# Patient Record
Sex: Female | Born: 1970 | Race: White | Hispanic: No | Smoking: Never smoker
Health system: Southern US, Community
[De-identification: ages and names within clinical notes are randomized; demographics above are authoritative.]

## PROBLEM LIST (undated history)

## (undated) HISTORY — PX: WISDOM TOOTH EXTRACTION: SHX21

---

## 1992-06-17 HISTORY — PX: GYNECOLOGIC CRYOSURGERY: SHX857

## 2002-11-19 ENCOUNTER — Other Ambulatory Visit: Admission: RE | Admit: 2002-11-19 | Discharge: 2002-11-19 | Payer: Self-pay | Admitting: Obstetrics & Gynecology

## 2005-02-09 ENCOUNTER — Inpatient Hospital Stay (HOSPITAL_COMMUNITY): Admission: AD | Admit: 2005-02-09 | Discharge: 2005-02-11 | Payer: Self-pay | Admitting: Obstetrics & Gynecology

## 2007-08-24 ENCOUNTER — Inpatient Hospital Stay (HOSPITAL_COMMUNITY): Admission: AD | Admit: 2007-08-24 | Discharge: 2007-08-26 | Payer: Self-pay | Admitting: Obstetrics & Gynecology

## 2010-10-30 NOTE — H&P (Signed)
NAME:  Natalie Reeves, VANDERHEYDEN              ACCOUNT NO.:  [id number]   MEDICAL RECORD NO.:  [id number]          PATIENT TYPE:  INP   LOCATION:  9167                          FACILITY:  [facility]   PHYSICIAN:  Lenoard Aden, M.D.DATE OF BIRTH:  Jan 05, 1971   DATE OF ADMISSION:  08/24/2007  DATE OF DISCHARGE:                              HISTORY & PHYSICAL   CHIEF COMPLAINT:  Prodrome of labor.   HISTORY:  She is a 40 year old white female, G 3, P 2, at 40-3/7th  weeks, who presents with increased frequency of contractions.  She  desires no intervention.   ALLERGIES:  SULFA.   CURRENT MEDICATIONS:  Prenatal vitamins.   PAST MEDICAL/SURGICAL HISTORY:  1. She has a history of spontaneous vaginal delivery x2.  2. Cryosurgery.  3. Wisdom tooth extraction.  4. History of heart murmur which has not been medicated and was only      noted during pregnancy.   FAMILY HISTORY:  A family history of uterine cancer, tuberculosis, heart  disease, rheumatoid arthritis, migraine headaches and high cholesterol.   PRENATAL COURSE TO DATE:  By review of record is uncomplicated.   PHYSICAL EXAMINATION:  GENERAL:  She is a well-developed and well-  nourished white female, in no acute distress.  HEENT:  Normal.  LUNGS:  Clear.  HEART:  Regular rate and rhythm.  ABDOMEN:  Soft, gravid, nontender.  Estimated fetal weight is 6-1/2  pounds.  PELVIC EXAM:  Cervix is 4 to 5 cm, 90% vertex, 0-+1 station.  EXTREMITIES:  No cords.  NEUROLOGIC:  Nonfocal.  SKIN:  Intact.   IMPRESSION:  A 40-week intrauterine pregnancy with prodrome of labor  pattern, GBS negative.   PLAN:  Artificial rupture of membranes performed.  Pitocin augmentation  as needed.  Anticipated vaginal delivery.      Lenoard Aden, M.D.  Electronically Signed     RJT/MEDQ  D:  08/25/2007  T:  08/25/2007  Job:  [id number]

## 2011-03-11 LAB — CBC
HCT: 37.4
Hemoglobin: 12.8
MCV: 91.2
RBC: 3.95
RBC: 4.08
RDW: 12.8
WBC: 13.1 — ABNORMAL HIGH
WBC: 15.4 — ABNORMAL HIGH

## 2011-03-11 LAB — RPR: RPR Ser Ql: NONREACTIVE

## 2016-09-03 ENCOUNTER — Encounter: Payer: Self-pay | Admitting: Obstetrics & Gynecology

## 2017-10-16 ENCOUNTER — Other Ambulatory Visit: Payer: Self-pay | Admitting: Obstetrics & Gynecology

## 2017-10-16 ENCOUNTER — Telehealth: Payer: Self-pay | Admitting: *Deleted

## 2017-10-16 DIAGNOSIS — Z1231 Encounter for screening mammogram for malignant neoplasm of breast: Secondary | ICD-10-CM

## 2017-10-16 MED ORDER — NORETHINDRONE 0.35 MG PO TABS: 1.0000 | ORAL_TABLET | Freq: Every day | ORAL | 1 refills | Status: DC

## 2017-10-16 NOTE — Telephone Encounter (Signed)
Patient has annual scheduled on 02/03/18, needs refill on Errin 0.35 mg 1 po daily #84 Rx sent. Paper chart has arrived.

## 2017-11-12 ENCOUNTER — Ambulatory Visit: Payer: Self-pay

## 2017-12-03 ENCOUNTER — Ambulatory Visit
Admission: RE | Admit: 2017-12-03 | Discharge: 2017-12-03 | Disposition: A | Discharge status: Home / Self Care | Payer: BC Managed Care – PPO | Source: Ambulatory Visit | Attending: Obstetrics & Gynecology | Admitting: Obstetrics & Gynecology

## 2017-12-03 ENCOUNTER — Other Ambulatory Visit: Payer: Self-pay | Admitting: Obstetrics & Gynecology

## 2017-12-03 DIAGNOSIS — Z1231 Encounter for screening mammogram for malignant neoplasm of breast: Secondary | ICD-10-CM

## 2018-02-03 ENCOUNTER — Encounter: Payer: Self-pay | Admitting: Obstetrics & Gynecology

## 2018-02-03 ENCOUNTER — Ambulatory Visit: Payer: BC Managed Care – PPO | Admitting: Obstetrics & Gynecology

## 2018-02-03 VITALS — BP 118/74 | Ht 65.0 in | Wt 149.0 lb

## 2018-02-03 DIAGNOSIS — R87810 Cervical high risk human papillomavirus (HPV) DNA test positive: Secondary | ICD-10-CM | POA: Diagnosis not present

## 2018-02-03 DIAGNOSIS — F3281 Premenstrual dysphoric disorder: Secondary | ICD-10-CM

## 2018-02-03 DIAGNOSIS — Z9189 Other specified personal risk factors, not elsewhere classified: Secondary | ICD-10-CM

## 2018-02-03 DIAGNOSIS — Z1151 Encounter for screening for human papillomavirus (HPV): Secondary | ICD-10-CM

## 2018-02-03 DIAGNOSIS — Z01419 Encounter for gynecological examination (general) (routine) without abnormal findings: Secondary | ICD-10-CM

## 2018-02-03 MED ORDER — NORETHINDRONE 0.35 MG PO TABS: 1.0000 | ORAL_TABLET | Freq: Every day | ORAL | 4 refills | Status: DC

## 2018-02-03 NOTE — Addendum Note (Signed)
Addended by: Berna SpareASTILLO, BLANCA A on: 02/03/2018 04:49 PM   Modules accepted: Orders

## 2018-02-03 NOTE — Progress Notes (Signed)
Natalie Reeves 05-09-1971 [id number]   History:    47 y.o. G3P3L3 Married. Vasectomy.  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  2 youngest are in elementary and middle school.  Daughter is 45 yo with 3 children.  RP:  Established patient presenting for annual gyn exam   HPI: Well on the progestin only pill to control PMDD.  But progressively the PMDD symptoms were re-increasing, therefore patient was started on Effexor 25 mg twice daily recently by her family physician.  No breakthrough bleeding.  No pelvic pain.  No pain with intercourse.  Urine and bowel movements normal.  Breasts normal.  Body mass index 24.79.  Physically active on a regular basis.  Health labs normal with family physician.  Past medical history,surgical history, family history and social history were all reviewed and documented in the EPIC chart.  Gynecologic History Patient's last menstrual period was 01/15/2018. Contraception: abstinence and vasectomy Last Pap: 08/2016. Results were: Negative/HPV HR negative.  (HPV HR pos in 2017) Last mammogram: 11/2017. Results were: Negative Bone Density: Never Colonoscopy: Never  Obstetric History OB History  Gravida Para Term Preterm AB Living  3 3       3   SAB TAB Ectopic Multiple Live Births               # Outcome Date GA Lbr Len/2nd Weight Sex Delivery Anes PTL Lv  3 Para           2 Para           1 Para              ROS: A ROS was performed and pertinent positives and negatives are included in the history.  GENERAL: No fevers or chills. HEENT: No change in vision, no earache, sore throat or sinus congestion. NECK: No pain or stiffness. CARDIOVASCULAR: No chest pain or pressure. No palpitations. PULMONARY: No shortness of breath, cough or wheeze. GASTROINTESTINAL: No abdominal pain, nausea, vomiting or diarrhea, melena or bright red blood per rectum. GENITOURINARY: No urinary frequency, urgency, hesitancy or dysuria. MUSCULOSKELETAL: No joint or muscle pain, no back  pain, no recent trauma. DERMATOLOGIC: No rash, no itching, no lesions. ENDOCRINE: No polyuria, polydipsia, no heat or cold intolerance. No recent change in weight. HEMATOLOGICAL: No anemia or easy bruising or bleeding. NEUROLOGIC: No headache, seizures, numbness, tingling or weakness. PSYCHIATRIC: No depression, no loss of interest in normal activity or change in sleep pattern.     Exam:   BP 118/74   Ht 5\' 5"  (1.651 m)   Wt 149 lb (67.6 kg)   LMP 01/15/2018 Comment: PILL  BMI 24.79 kg/m   Body mass index is 24.79 kg/m.  General appearance : Well developed well nourished female. No acute distress HEENT: Eyes: no retinal hemorrhage or exudates,  Neck supple, trachea midline, no carotid bruits, no thyroidmegaly Lungs: Clear to auscultation, no rhonchi or wheezes, or rib retractions  Heart: Regular rate and rhythm, no murmurs or gallops Breast:Examined in sitting and supine position were symmetrical in appearance, no palpable masses or tenderness,  no skin retraction, no nipple inversion, no nipple discharge, no skin discoloration, no axillary or supraclavicular lymphadenopathy Abdomen: no palpable masses or tenderness, no rebound or guarding Extremities: no edema or skin discoloration or tenderness  Pelvic: Vulva: Normal             Vagina: No gross lesions or discharge  Cervix: No gross lesions or discharge.  Pap/HPV HR done  Uterus  AV, normal size, shape and consistency, non-tender and mobile  Adnexa  Without masses or tenderness  Anus: Normal   Assessment/Plan:  47 y.o. female for annual exam   1. Encounter for routine gynecological examination with Papanicolaou smear of cervix Normal gynecologic exam.  Pap with high-risk HPV done today.  Breast exam normal.  Screening mammogram was negative in June 2019.  Fasting health labs with family physician were all normal.  Body mass index 24.79.  Physically active regularly and healthy nutrition.  2. Relies on partner vasectomy for  contraception  3. PMDD (premenstrual dysphoric disorder) PMDD improved on the progestin only pill, but recently symptoms were really increasing.  Therefore started on Effexor 25 mg twice a day by her family physician recently.   4. Cervical high risk HPV (human papillomavirus) test positive High risk HPV was positive in 2017, it was negative in 2018.  Pap test with high risk HPV done today.  Other orders - venlafaxine (EFFEXOR) 25 MG tablet; Take 25 mg by mouth 2 (two) times daily. - norethindrone (MICRONOR,CAMILA,ERRIN) 0.35 MG tablet; Take 1 tablet (0.35 mg total) by mouth daily.  [name] MD, 2:54 PM 02/03/2018

## 2018-02-03 NOTE — Patient Instructions (Signed)
1. Encounter for routine gynecological examination with Papanicolaou smear of cervix Normal gynecologic exam.  Pap with high-risk HPV done today.  Breast exam normal.  Screening mammogram was negative in June 2019.  Fasting health labs with family physician were all normal.  Body mass index 24.79.  Physically active regularly and healthy nutrition.  2. Relies on partner vasectomy for contraception  3. PMDD (premenstrual dysphoric disorder) PMDD improved on the progestin only pill, but recently symptoms were really increasing.  Therefore started on Effexor 25 mg twice a day by her family physician recently.   4. Cervical high risk HPV (human papillomavirus) test positive High risk HPV was positive in 2017, it was negative in 2018.  Pap test with high risk HPV done today.  Other orders - venlafaxine (EFFEXOR) 25 MG tablet; Take 25 mg by mouth 2 (two) times daily. - norethindrone (MICRONOR,CAMILA,ERRIN) 0.35 MG tablet; Take 1 tablet (0.35 mg total) by mouth daily.  Natalie Reeves, it was a pleasure seeing you today!  I will inform you of your results as soon as they are available.

## 2018-02-04 LAB — PAP, TP IMAGING W/ HPV RNA, RFLX HPV TYPE 16,18/45: HPV DNA High Risk: NOT DETECTED

## 2018-12-11 ENCOUNTER — Other Ambulatory Visit: Payer: Self-pay | Admitting: Obstetrics & Gynecology

## 2018-12-11 DIAGNOSIS — Z1231 Encounter for screening mammogram for malignant neoplasm of breast: Secondary | ICD-10-CM

## 2019-01-26 ENCOUNTER — Other Ambulatory Visit: Payer: Self-pay

## 2019-01-26 ENCOUNTER — Ambulatory Visit
Admission: RE | Admit: 2019-01-26 | Discharge: 2019-01-26 | Disposition: A | Discharge status: Home / Self Care | Payer: BC Managed Care – PPO | Source: Ambulatory Visit | Attending: Obstetrics & Gynecology | Admitting: Obstetrics & Gynecology

## 2019-01-26 DIAGNOSIS — Z1231 Encounter for screening mammogram for malignant neoplasm of breast: Secondary | ICD-10-CM

## 2019-02-05 ENCOUNTER — Ambulatory Visit: Payer: BC Managed Care – PPO | Admitting: Obstetrics & Gynecology

## 2019-02-05 ENCOUNTER — Encounter: Payer: Self-pay | Admitting: Obstetrics & Gynecology

## 2019-02-05 ENCOUNTER — Other Ambulatory Visit: Payer: Self-pay

## 2019-02-05 VITALS — BP 128/78 | Ht 64.75 in | Wt 159.0 lb

## 2019-02-05 DIAGNOSIS — F3281 Premenstrual dysphoric disorder: Secondary | ICD-10-CM

## 2019-02-05 DIAGNOSIS — Z01419 Encounter for gynecological examination (general) (routine) without abnormal findings: Secondary | ICD-10-CM | POA: Diagnosis not present

## 2019-02-05 DIAGNOSIS — R87810 Cervical high risk human papillomavirus (HPV) DNA test positive: Secondary | ICD-10-CM | POA: Diagnosis not present

## 2019-02-05 DIAGNOSIS — Z9189 Other specified personal risk factors, not elsewhere classified: Secondary | ICD-10-CM

## 2019-02-05 MED ORDER — NORETHINDRONE 0.35 MG PO TABS: 1.0000 | ORAL_TABLET | Freq: Every day | ORAL | 4 refills | Status: DC

## 2019-02-05 NOTE — Progress Notes (Signed)
Natalie Reeves 02/26/1971 [id number]   History:    48 y.o. G3P3L3 Married. Vasectomy.  Nature conservation officer in Nurse, children's at [employer].  2 youngest are in elementary and middle school.  Daughter is 81 yo with 3 children.  RP:  Established patient presenting for annual gyn exam   HPI: PMDD symptoms controled on the Progestin-only BCPs and Effexor 75 mg twice daily.  No breakthrough bleeding.  Menses every 3-4 weeks.  No pelvic pain. No pain with intercourse.  Urine and bowel movements normal.  Breasts normal.  Body mass index 26.66.  Will increase physical activities and decrease calories.  Health labs normal with family physician.   Past medical history,surgical history, family history and social history were all reviewed and documented in the EPIC chart.  Gynecologic History Patient's last menstrual period was 01/20/2019. Contraception: vasectomy Last Pap: 01/2018. Results were: Negative/HPV HR neg.  H/O HPV HR positive. Last mammogram: 01/2019 Results were: Negative Bone Density: Never Colonoscopy: Never  Obstetric History OB History  Gravida Para Term Preterm AB Living  3 3       3   SAB TAB Ectopic Multiple Live Births               # Outcome Date GA Lbr Len/2nd Weight Sex Delivery Anes PTL Lv  3 Para           2 Para           1 Para              ROS: A ROS was performed and pertinent positives and negatives are included in the history.  GENERAL: No fevers or chills. HEENT: No change in vision, no earache, sore throat or sinus congestion. NECK: No pain or stiffness. CARDIOVASCULAR: No chest pain or pressure. No palpitations. PULMONARY: No shortness of breath, cough or wheeze. GASTROINTESTINAL: No abdominal pain, nausea, vomiting or diarrhea, melena or bright red blood per rectum. GENITOURINARY: No urinary frequency, urgency, hesitancy or dysuria. MUSCULOSKELETAL: No joint or muscle pain, no back pain, no recent trauma. DERMATOLOGIC: No rash, no itching, no lesions. ENDOCRINE: No  polyuria, polydipsia, no heat or cold intolerance. No recent change in weight. HEMATOLOGICAL: No anemia or easy bruising or bleeding. NEUROLOGIC: No headache, seizures, numbness, tingling or weakness. PSYCHIATRIC: No depression, no loss of interest in normal activity or change in sleep pattern.     Exam:   BP 128/78   Ht 5' 4.75" (1.645 m)   Wt 159 lb (72.1 kg)   LMP 01/20/2019   BMI 26.66 kg/m   Body mass index is 26.66 kg/m.  General appearance : Well developed well nourished female. No acute distress HEENT: Eyes: no retinal hemorrhage or exudates,  Neck supple, trachea midline, no carotid bruits, no thyroidmegaly Lungs: Clear to auscultation, no rhonchi or wheezes, or rib retractions  Heart: Regular rate and rhythm, no murmurs or gallops Breast:Examined in sitting and supine position were symmetrical in appearance, no palpable masses or tenderness,  no skin retraction, no nipple inversion, no nipple discharge, no skin discoloration, no axillary or supraclavicular lymphadenopathy Abdomen: no palpable masses or tenderness, no rebound or guarding Extremities: no edema or skin discoloration or tenderness  Pelvic: Vulva: Normal             Vagina: No gross lesions or discharge  Cervix: No gross lesions or discharge.  Pap reflex done.  Uterus  AV, normal size, shape and consistency, non-tender and mobile  Adnexa  Without masses or tenderness  Anus: Normal   Assessment/Plan:  48 y.o. female for annual exam   1. Encounter for routine gynecological examination with Papanicolaou smear of cervix Normal gynecologic exam.  Pap reflex done.  Breast exam normal.  Last mammography August 2020 was negative.  Health labs with family physician.  Body mass index 26.66.  Recommend a mild decrease in calories/carbs.  Will increase aerobic activities to 5 times a week and weightlifting every 2 days.  2. Cervical high risk human papillomavirus (HPV) DNA test positive  3. Relies on partner vasectomy  for contraception  4. PMDD (premenstrual dysphoric disorder) Controlled on the progestin only birth control pill and Effexor.  No contraindication to continue.  Prescription sent to pharmacy.  Other orders - venlafaxine (EFFEXOR) 75 MG tablet; Take 75 mg by mouth 2 (two) times daily. - norethindrone (MICRONOR) 0.35 MG tablet; Take 1 tablet (0.35 mg total) by mouth daily.  Genia DelMarie-Lyne Taj Arteaga MD, 3:39 PM 02/05/2019

## 2019-02-08 LAB — PAP IG W/ RFLX HPV ASCU

## 2019-02-13 ENCOUNTER — Encounter: Payer: Self-pay | Admitting: Obstetrics & Gynecology

## 2019-02-13 NOTE — Patient Instructions (Signed)
1. Encounter for routine gynecological examination with Papanicolaou smear of cervix Normal gynecologic exam.  Pap reflex done.  Breast exam normal.  Last mammography August 2020 was negative.  Health labs with family physician.  Body mass index 26.66.  Recommend a mild decrease in calories/carbs.  Will increase aerobic activities to 5 times a week and weightlifting every 2 days.  2. Cervical high risk human papillomavirus (HPV) DNA test positive  3. Relies on partner vasectomy for contraception  4. PMDD (premenstrual dysphoric disorder) Controlled on the progestin only birth control pill and Effexor.  No contraindication to continue.  Prescription sent to pharmacy.  Other orders - venlafaxine (EFFEXOR) 75 MG tablet; Take 75 mg by mouth 2 (two) times daily. - norethindrone (MICRONOR) 0.35 MG tablet; Take 1 tablet (0.35 mg total) by mouth daily.  Natalie Reeves, it was a pleasure seeing you today!  I will inform you of your results as soon as they are available.

## 2019-12-17 ENCOUNTER — Other Ambulatory Visit: Payer: Self-pay | Admitting: Obstetrics & Gynecology

## 2019-12-17 DIAGNOSIS — Z1231 Encounter for screening mammogram for malignant neoplasm of breast: Secondary | ICD-10-CM

## 2020-01-27 ENCOUNTER — Other Ambulatory Visit: Payer: Self-pay

## 2020-01-27 ENCOUNTER — Ambulatory Visit
Admission: RE | Admit: 2020-01-27 | Discharge: 2020-01-27 | Disposition: A | Discharge status: Home / Self Care | Payer: BC Managed Care – PPO | Source: Ambulatory Visit | Attending: Obstetrics & Gynecology | Admitting: Obstetrics & Gynecology

## 2020-01-27 DIAGNOSIS — Z1231 Encounter for screening mammogram for malignant neoplasm of breast: Secondary | ICD-10-CM

## 2020-02-25 ENCOUNTER — Encounter: Payer: Self-pay | Admitting: Obstetrics & Gynecology

## 2020-02-25 ENCOUNTER — Other Ambulatory Visit: Payer: Self-pay

## 2020-02-25 ENCOUNTER — Ambulatory Visit: Payer: BC Managed Care – PPO | Admitting: Obstetrics & Gynecology

## 2020-02-25 VITALS — BP 124/78 | Ht 65.0 in | Wt 143.0 lb

## 2020-02-25 DIAGNOSIS — Z23 Encounter for immunization: Secondary | ICD-10-CM | POA: Diagnosis not present

## 2020-02-25 DIAGNOSIS — F3281 Premenstrual dysphoric disorder: Secondary | ICD-10-CM

## 2020-02-25 DIAGNOSIS — Z01419 Encounter for gynecological examination (general) (routine) without abnormal findings: Secondary | ICD-10-CM

## 2020-02-25 DIAGNOSIS — Z9189 Other specified personal risk factors, not elsewhere classified: Secondary | ICD-10-CM

## 2020-02-25 MED ORDER — NORETHINDRONE 0.35 MG PO TABS: 1.0000 | ORAL_TABLET | Freq: Every day | ORAL | 4 refills | Status: DC

## 2020-02-25 NOTE — Addendum Note (Signed)
Addended by: Berna Spare A on: 02/25/2020 03:04 PM   Modules accepted: Orders

## 2020-02-25 NOTE — Progress Notes (Signed)
Natalie Reeves [birth year] [id number]   History:    49 y.o. G3P3L3 Married. Vasectomy. 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 Daughter is 31 yo with 3 children.  [id number] presenting for annual gyn exam   HPI: PMDD symptoms controled on the Progestin-only BCPs and Effexor 75 mg twice daily. No breakthrough bleeding currently with menses every 4 weeks.  (Had a few months of BTB every 2 weeks in the spring) No pelvic pain. No pain with intercourse. Urine and bowel movements normal. Breasts normal. Body mass index 23.8.  Good fitness and nutrition. Health labs normal with family physician.   Past medical history,surgical history, family history and social history were all reviewed and documented in the EPIC chart.  Gynecologic History Patient's last menstrual period was 02/04/2020.  Obstetric History OB History  Gravida Para Term Preterm AB Living  3 3       3   SAB TAB Ectopic Multiple Live Births               # Outcome Date GA Lbr Len/2nd Weight Sex Delivery Anes PTL Lv  3 Para           2 Para           1 Para              ROS: A ROS was performed and pertinent positives and negatives are included in the history.  GENERAL: No fevers or chills. HEENT: No change in vision, no earache, sore throat or sinus congestion. NECK: No pain or stiffness. CARDIOVASCULAR: No chest pain or pressure. No palpitations. PULMONARY: No shortness of breath, cough or wheeze. GASTROINTESTINAL: No abdominal pain, nausea, vomiting or diarrhea, melena or bright red blood per rectum. GENITOURINARY: No urinary frequency, urgency, hesitancy or dysuria. MUSCULOSKELETAL: No joint or muscle pain, no back pain, no recent trauma. DERMATOLOGIC: No rash, no itching, no lesions. ENDOCRINE: No polyuria, polydipsia, no heat or cold intolerance. No recent change in weight. HEMATOLOGICAL: No anemia or easy bruising or bleeding. NEUROLOGIC: No headache,  seizures, numbness, tingling or weakness. PSYCHIATRIC: No depression, no loss of interest in normal activity or change in sleep pattern.     Exam:   BP 124/78   Ht 5\' 5"  (1.651 m)   Wt 143 lb (64.9 kg)   LMP 02/04/2020 Comment: PILL  BMI 23.80 kg/m   Body mass index is 23.8 kg/m.  General appearance : Well developed well nourished female. No acute distress HEENT: Eyes: no retinal hemorrhage or exudates,  Neck supple, trachea midline, no carotid bruits, no thyroidmegaly Lungs: Clear to auscultation, no rhonchi or wheezes, or rib retractions  Heart: Regular rate and rhythm, no murmurs or gallops Breast:Examined in sitting and supine position were symmetrical in appearance, no palpable masses or tenderness,  no skin retraction, no nipple inversion, no nipple discharge, no skin discoloration, no axillary or supraclavicular lymphadenopathy Abdomen: no palpable masses or tenderness, no rebound or guarding Extremities: no edema or skin discoloration or tenderness  Pelvic: Vulva: Normal             Vagina: No gross lesions or discharge  Cervix: No gross lesions or discharge  Uterus  AV, normal size, shape and consistency, non-tender and mobile  Adnexa  Without masses or tenderness  Anus: Normal   Assessment/Plan:  49 y.o. female for annual exam   1. Well female exam with routine gynecological exam Normal gynecologic exam.  Pap test August 2020  was negative with negative high-risk HPV, no indication to repeat this year.  Breast exam normal.  Screening mammogram August 2021 was negative.  Health labs with family physician.  Very good body mass index at 23.8.  Continue with fitness and healthy nutrition.  2. PMDD (premenstrual dysphoric disorder) PMDD controlled on the progestin only pill and Effexor 75 mg twice daily.  No contraindication to continue.  We will continue with same management.  Prescription of the progestin pill sent to pharmacy.  Precautions given in case patient  experiences severe breakthrough bleeding.  3. Relies on partner vasectomy for contraception  Other orders - norethindrone (MICRONOR) 0.35 MG tablet; Take 1 tablet (0.35 mg total) by mouth daily.  Genia Del MD, 2:34 PM 02/25/2020

## 2020-08-13 IMAGING — MG DIGITAL SCREENING BILAT W/ TOMO W/ CAD
8 series · 9 of 24 positions shown · non-contrast
Comparison: Previous exam(s).

CLINICAL DATA: Screening.

EXAM:
DIGITAL SCREENING BILATERAL MAMMOGRAM WITH TOMO AND CAD

[L CC synth-2D]
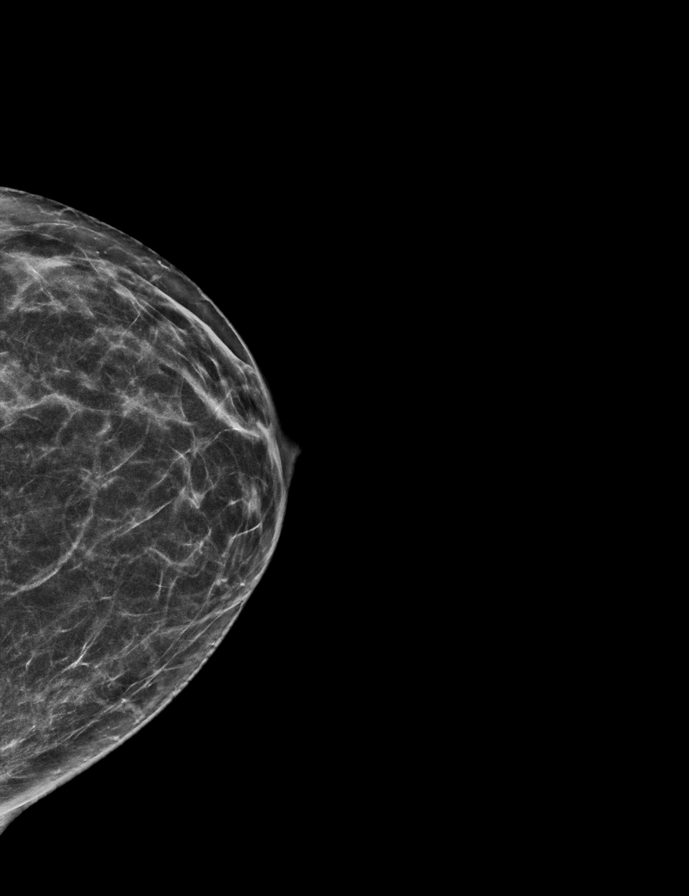

[R MLO synth-2D]
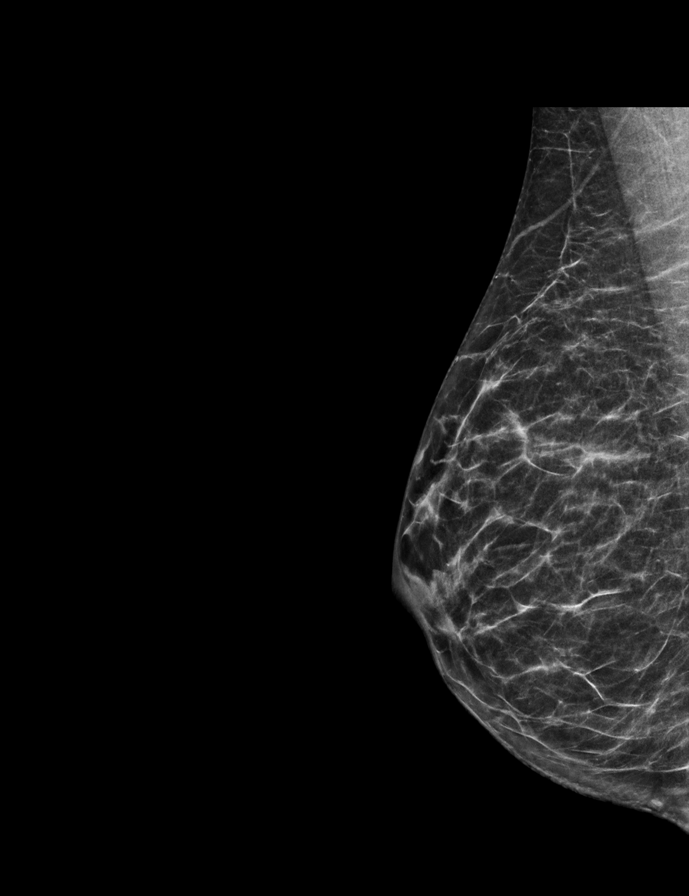

[R CC synth-2D]
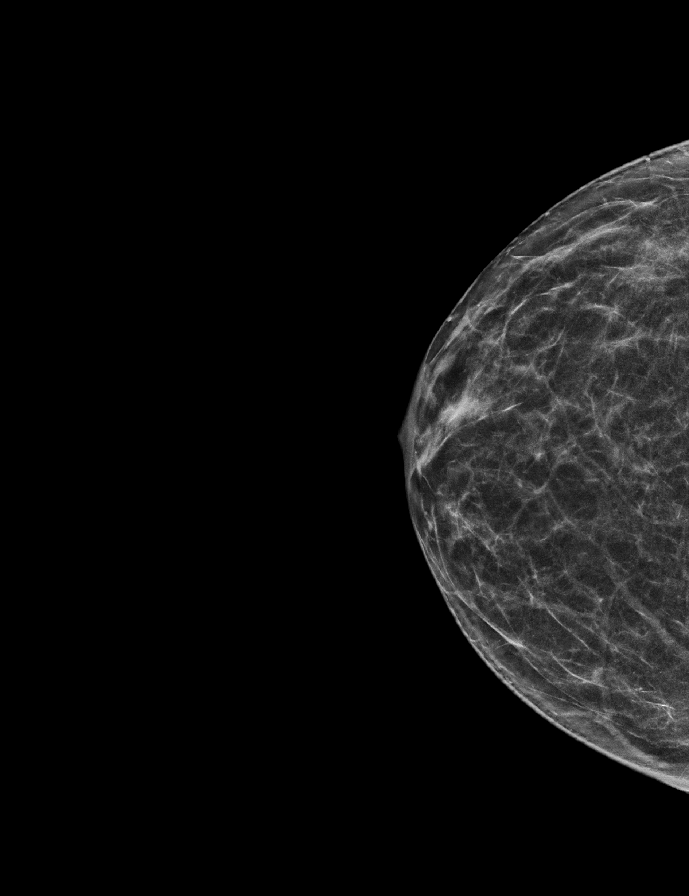

[L MLO synth-2D]
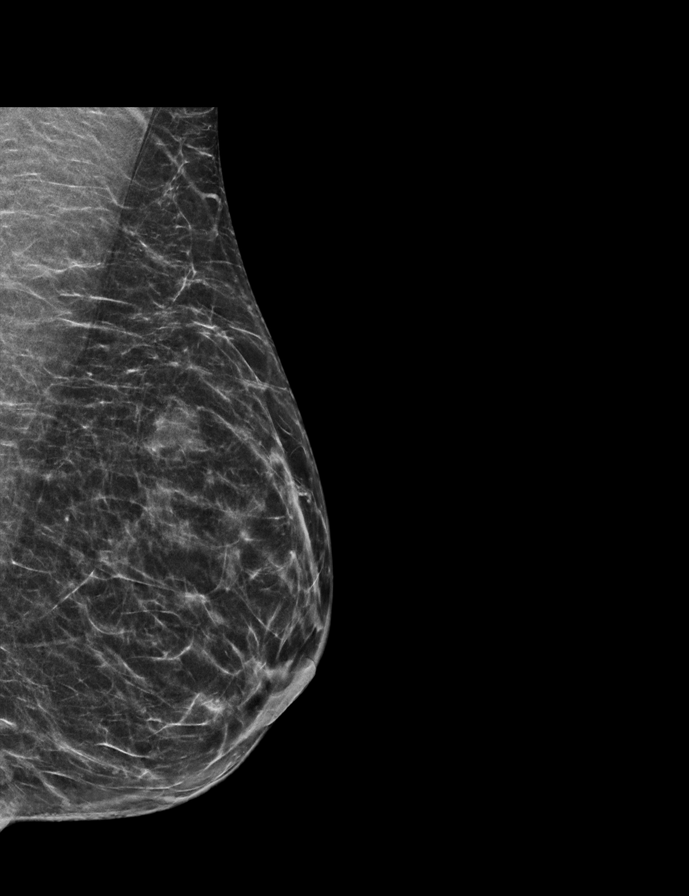

[R CC tomo · 2 of 51 frames shown]
[frame 17/51]
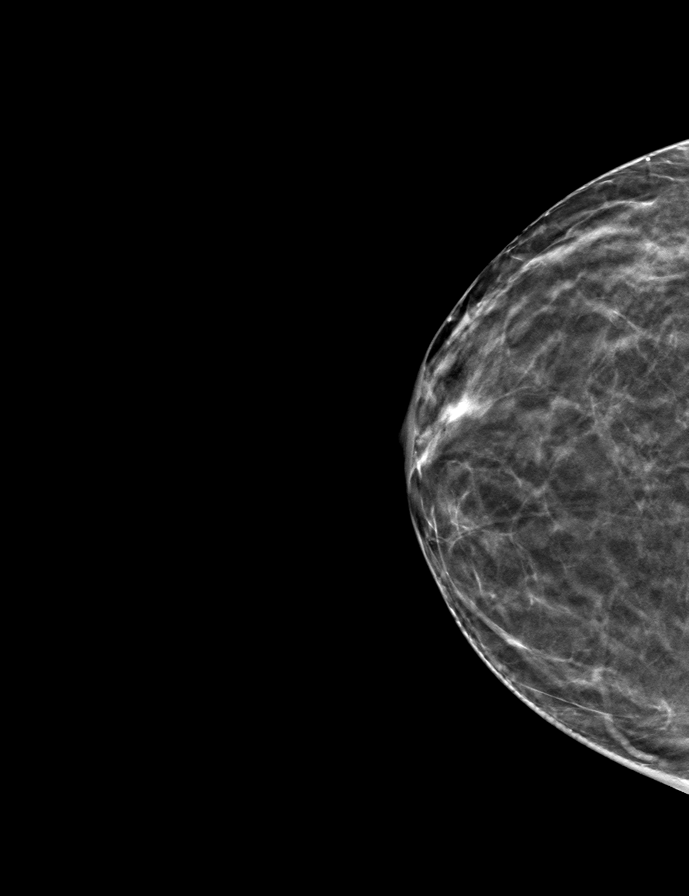
[frame 26/51]
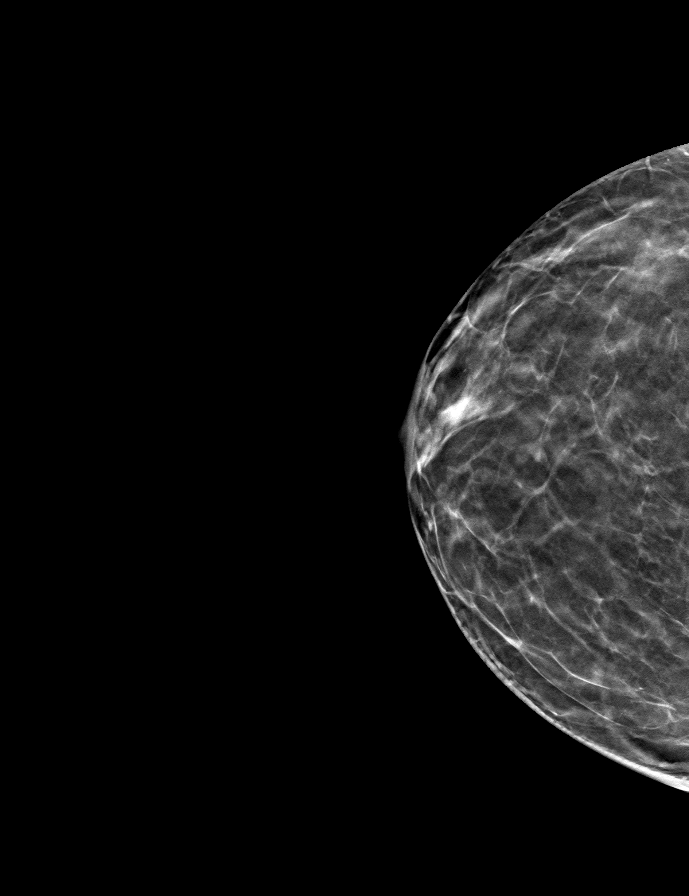

[L CC tomo · tomo slice 27/53.0]
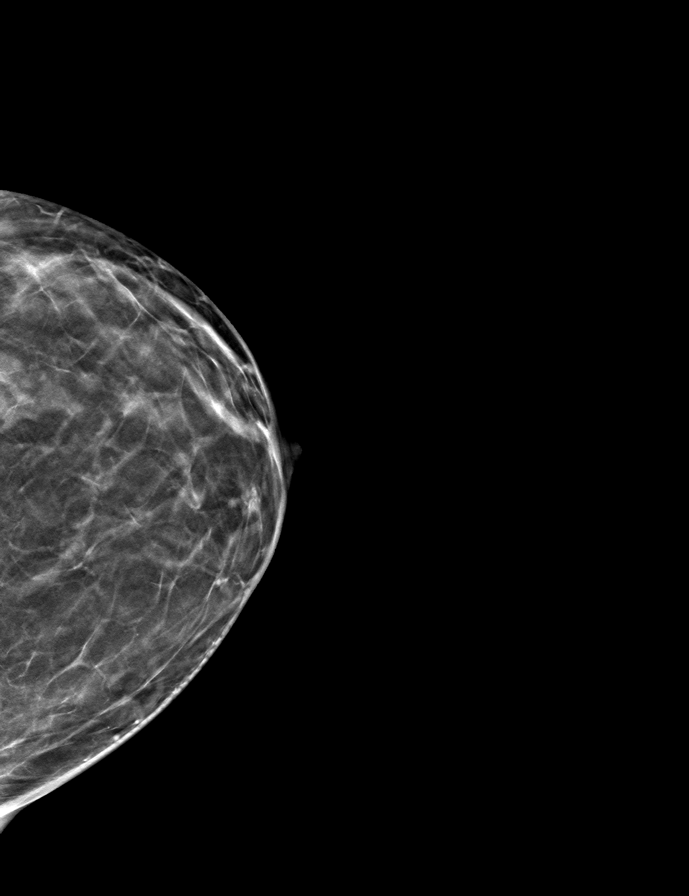

[L MLO tomo · tomo slice 27/54.0]
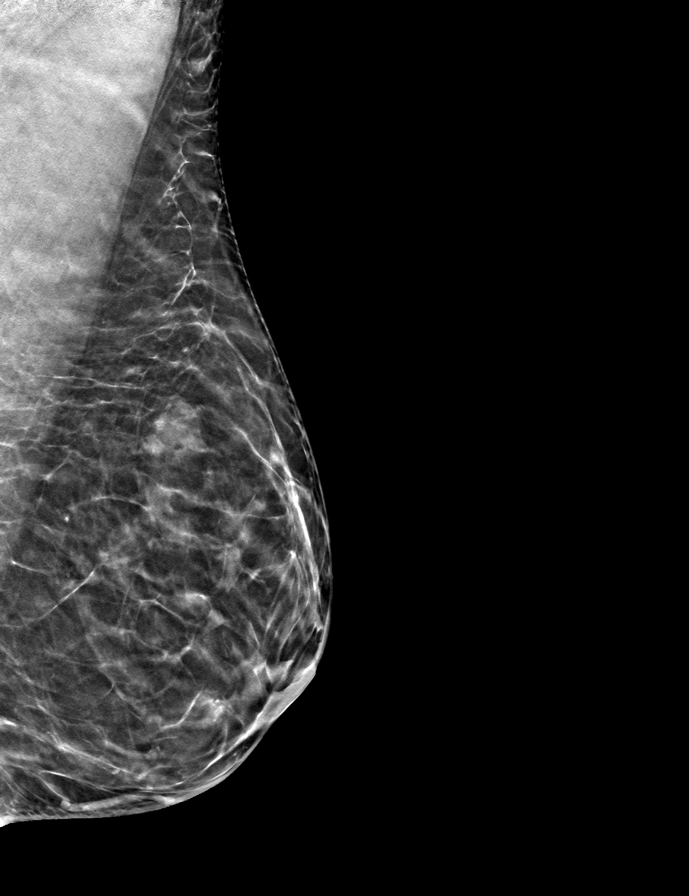

[R MLO tomo · tomo slice 27/53.0]
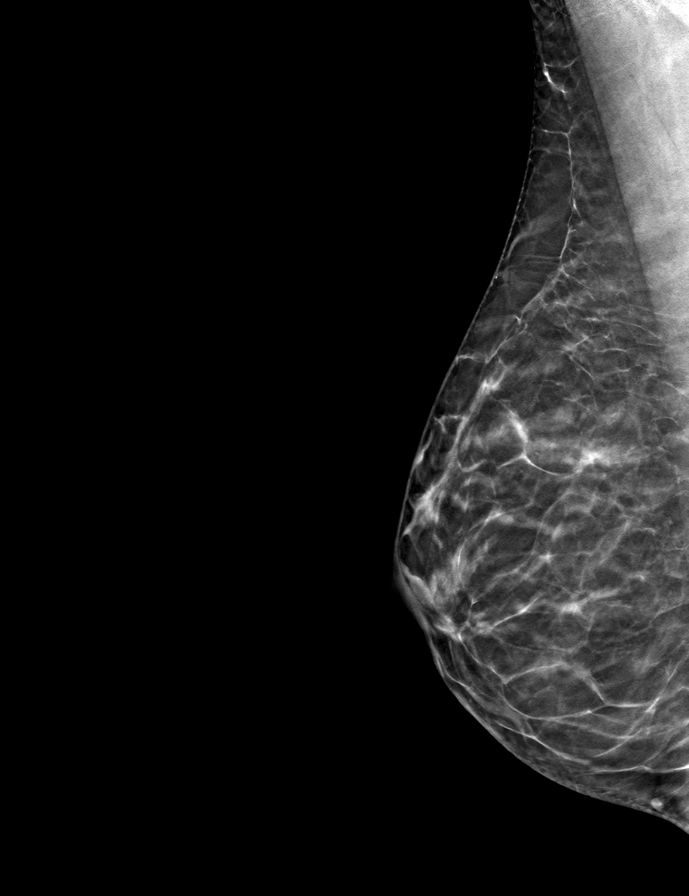

[9 of 24 positions shown; findings below may reference images not displayed]

ACR Breast Density Category b: There are scattered areas of
fibroglandular density.
FINDINGS: There are no findings suspicious for malignancy. Images were
processed with CAD.
IMPRESSION: No mammographic evidence of malignancy. A result letter of this
screening mammogram will be mailed directly to the patient.

RECOMMENDATION:
Screening mammogram in one year. (Code:[id number])

BI-RADS CATEGORY  1: Negative.

## 2021-01-09 ENCOUNTER — Other Ambulatory Visit: Payer: Self-pay | Admitting: Obstetrics & Gynecology

## 2021-01-09 DIAGNOSIS — Z1231 Encounter for screening mammogram for malignant neoplasm of breast: Secondary | ICD-10-CM

## 2021-02-28 DIAGNOSIS — Z1231 Encounter for screening mammogram for malignant neoplasm of breast: Secondary | ICD-10-CM

## 2021-04-02 ENCOUNTER — Ambulatory Visit
Admission: RE | Admit: 2021-04-02 | Discharge: 2021-04-02 | Disposition: A | Discharge status: Home / Self Care | Payer: BC Managed Care – PPO | Source: Ambulatory Visit | Attending: Obstetrics & Gynecology | Admitting: Obstetrics & Gynecology

## 2021-04-02 ENCOUNTER — Other Ambulatory Visit: Payer: Self-pay

## 2021-04-02 DIAGNOSIS — Z1231 Encounter for screening mammogram for malignant neoplasm of breast: Secondary | ICD-10-CM

## 2021-04-17 ENCOUNTER — Ambulatory Visit: Payer: BC Managed Care – PPO | Admitting: Obstetrics & Gynecology

## 2021-04-28 ENCOUNTER — Other Ambulatory Visit: Payer: Self-pay | Admitting: Obstetrics & Gynecology

## 2021-04-30 NOTE — Telephone Encounter (Signed)
Annual exam scheduled on 05/02/21

## 2021-05-02 ENCOUNTER — Other Ambulatory Visit: Payer: Self-pay

## 2021-05-02 ENCOUNTER — Ambulatory Visit (INDEPENDENT_AMBULATORY_CARE_PROVIDER_SITE_OTHER): Payer: BC Managed Care – PPO | Admitting: Obstetrics & Gynecology

## 2021-05-02 ENCOUNTER — Encounter: Payer: Self-pay | Admitting: Obstetrics & Gynecology

## 2021-05-02 VITALS — BP 110/78 | HR 72 | Ht 65.25 in | Wt 153.0 lb

## 2021-05-02 DIAGNOSIS — Z01419 Encounter for gynecological examination (general) (routine) without abnormal findings: Secondary | ICD-10-CM | POA: Diagnosis not present

## 2021-05-02 DIAGNOSIS — Z9189 Other specified personal risk factors, not elsewhere classified: Secondary | ICD-10-CM | POA: Diagnosis not present

## 2021-05-02 DIAGNOSIS — F3281 Premenstrual dysphoric disorder: Secondary | ICD-10-CM | POA: Diagnosis not present

## 2021-05-02 DIAGNOSIS — Z23 Encounter for immunization: Secondary | ICD-10-CM

## 2021-05-02 MED ORDER — NORETHINDRONE ACET-ETHINYL EST 1-20 MG-MCG PO TABS: 1.0000 | ORAL_TABLET | Freq: Every day | ORAL | 4 refills | Status: DC

## 2021-05-02 NOTE — Progress Notes (Signed)
Natalie Reeves Nov 15, 1970 [id number]   History:    50 y.o. G3P3L3 Married. Vasectomy.  Artist in Producer, television/film/video at [employer].  2 youngest are in middle and high school.  Daughter is 30 yo with 3 children.   RP:  Established patient presenting for annual gyn exam    HPI: PMDD symptoms controled on the Progestin-only BCPs, but unpredictable menses every 12-60 days.  On Effexor 75 mg twice daily. No pelvic pain. No pain with intercourse.  Pap Neg 01/2019.  Urine and bowel movements normal.  Breasts normal.  Screening mammo Neg 03/2021.  Body mass index 25.27.  Good fitness and nutrition.  Health labs normal with family physician.    Past medical history,surgical history, family history and social history were all reviewed and documented in the EPIC chart.  Gynecologic History Patient's last menstrual period was 04/15/2021 (exact date).  Obstetric History OB History  Gravida Para Term Preterm AB Living  3 3       3   SAB IAB Ectopic Multiple Live Births               # Outcome Date GA Lbr Len/2nd Weight Sex Delivery Anes PTL Lv  3 Para           2 Para           1 Para              ROS: A ROS was performed and pertinent positives and negatives are included in the history.  GENERAL: No fevers or chills. HEENT: No change in vision, no earache, sore throat or sinus congestion. NECK: No pain or stiffness. CARDIOVASCULAR: No chest pain or pressure. No palpitations. PULMONARY: No shortness of breath, cough or wheeze. GASTROINTESTINAL: No abdominal pain, nausea, vomiting or diarrhea, melena or bright red blood per rectum. GENITOURINARY: No urinary frequency, urgency, hesitancy or dysuria. MUSCULOSKELETAL: No joint or muscle pain, no back pain, no recent trauma. DERMATOLOGIC: No rash, no itching, no lesions. ENDOCRINE: No polyuria, polydipsia, no heat or cold intolerance. No recent change in weight. HEMATOLOGICAL: No anemia or easy bruising or bleeding. NEUROLOGIC: No headache, seizures, numbness,  tingling or weakness. PSYCHIATRIC: No depression, no loss of interest in normal activity or change in sleep pattern.     Exam:   BP 110/78   Pulse 72   Ht 5' 5.25" (1.657 m)   Wt 153 lb (69.4 kg)   LMP 04/15/2021 (Exact Date)   SpO2 96%   BMI 25.27 kg/m   Body mass index is 25.27 kg/m.  General appearance : Well developed well nourished female. No acute distress HEENT: Eyes: no retinal hemorrhage or exudates,  Neck supple, trachea midline, no carotid bruits, no thyroidmegaly Lungs: Clear to auscultation, no rhonchi or wheezes, or rib retractions  Heart: Regular rate and rhythm, no murmurs or gallops Breast:Examined in sitting and supine position were symmetrical in appearance, no palpable masses or tenderness,  no skin retraction, no nipple inversion, no nipple discharge, no skin discoloration, no axillary or supraclavicular lymphadenopathy Abdomen: no palpable masses or tenderness, no rebound or guarding Extremities: no edema or skin discoloration or tenderness  Pelvic: Vulva: Normal             Vagina: No gross lesions or discharge  Cervix: No gross lesions or discharge  Uterus  AV, normal size, shape and consistency, non-tender and mobile  Adnexa  Without masses or tenderness  Anus: Normal   Assessment/Plan:  50 y.o. female for annual exam  1. Well female exam with routine gynecological exam PMDD symptoms controled on the Progestin-only BCPs, but unpredictable menses every 12-60 days.  On Effexor 75 mg twice daily. No pelvic pain. No pain with intercourse.  Pap Neg 01/2019.  Urine and bowel movements normal.  Breasts normal.  Screening mammo Neg 03/2021.  Body mass index 25.27.  Good fitness and nutrition.  Health labs normal with family physician.   2. Relies on partner vasectomy for contraception  3. PMDD (premenstrual dysphoric disorder) Decision to control the cycle with the generic of LoEstrin 1/20 instead of the Progestin only pill.  No CI.  Usage reviewed and  prescription sent to pharmacy.  4. Need for immunization against influenza - Flu Vaccine QUAD 68mo+IM (Fluarix, Fluzone & Alfiuria Quad PF)  Other orders - ibuprofen (ADVIL) 200 MG tablet; 3 tablets as needed - ACIDOPHILUS LACTOBACILLUS PO; Take by mouth. - norethindrone-ethinyl estradiol (LOESTRIN) 1-20 MG-MCG tablet; Take 1 tablet by mouth daily.   Genia Del MD, 4:17 PM 05/02/2021

## 2022-05-01 ENCOUNTER — Other Ambulatory Visit: Payer: Self-pay | Admitting: Obstetrics & Gynecology

## 2022-05-01 ENCOUNTER — Ambulatory Visit
Admission: RE | Admit: 2022-05-01 | Discharge: 2022-05-01 | Disposition: A | Discharge status: Home / Self Care | Payer: BC Managed Care – PPO | Source: Ambulatory Visit | Attending: Obstetrics & Gynecology | Admitting: Obstetrics & Gynecology

## 2022-05-01 DIAGNOSIS — Z1231 Encounter for screening mammogram for malignant neoplasm of breast: Secondary | ICD-10-CM

## 2022-05-07 ENCOUNTER — Encounter: Payer: Self-pay | Admitting: Obstetrics & Gynecology

## 2022-05-07 ENCOUNTER — Ambulatory Visit (INDEPENDENT_AMBULATORY_CARE_PROVIDER_SITE_OTHER): Payer: BC Managed Care – PPO | Admitting: Obstetrics & Gynecology

## 2022-05-07 ENCOUNTER — Other Ambulatory Visit (HOSPITAL_COMMUNITY)
Admission: RE | Admit: 2022-05-07 | Discharge: 2022-05-07 | Disposition: A | Discharge status: Home / Self Care | Payer: BC Managed Care – PPO | Source: Ambulatory Visit | Attending: Obstetrics & Gynecology | Admitting: Obstetrics & Gynecology

## 2022-05-07 VITALS — BP 106/70 | HR 70 | Ht 64.5 in | Wt 158.0 lb

## 2022-05-07 DIAGNOSIS — F3281 Premenstrual dysphoric disorder: Secondary | ICD-10-CM | POA: Diagnosis not present

## 2022-05-07 DIAGNOSIS — Z01419 Encounter for gynecological examination (general) (routine) without abnormal findings: Secondary | ICD-10-CM

## 2022-05-07 DIAGNOSIS — Z9189 Other specified personal risk factors, not elsewhere classified: Secondary | ICD-10-CM

## 2022-05-07 MED ORDER — NORETHINDRONE 0.35 MG PO TABS: 1.0000 | ORAL_TABLET | Freq: Every day | ORAL | 4 refills | Status: DC

## 2022-05-07 NOTE — Progress Notes (Signed)
Natalie Reeves Mar 08, 1971 [id number]   History:    51 y.o. G3P3L3 Married. Vasectomy.  Artist in Producer, television/film/video at [employer].  2 youngest are in middle and high school.  Daughter is 67 yo with 3 children.   RP:  Established patient presenting for annual gyn exam    HPI: PMDD symptoms improved on BCPs with LoEstrin 1/20.  Having light menses every 1-3 months.  On Effexor 75 mg twice daily. No pelvic pain. No pain with intercourse.  Pap Neg 01/2019.  Repeat Pap today. Urine and bowel movements normal.  Breasts normal.  Screening mammo Neg 04/2022.  Body mass index 26.7.  Good fitness and nutrition.  Health labs with family physician.  Will schedule Colono this year.  Flu vaccine done at pharmacy.    Past medical history,surgical history, family history and social history were all reviewed and documented in the EPIC chart.  Gynecologic History Patient's last menstrual period was 02/08/2022 (exact date).  Obstetric History OB History  Gravida Para Term Preterm AB Living  3 3 3     3   SAB IAB Ectopic Multiple Live Births               # Outcome Date GA Lbr Len/2nd Weight Sex Delivery Anes PTL Lv  3 Term           2 Term           1 Term             ROS: A ROS was performed and pertinent positives and negatives are included in the history. GENERAL: No fevers or chills. HEENT: No change in vision, no earache, sore throat or sinus congestion. NECK: No pain or stiffness. CARDIOVASCULAR: No chest pain or pressure. No palpitations. PULMONARY: No shortness of breath, cough or wheeze. GASTROINTESTINAL: No abdominal pain, nausea, vomiting or diarrhea, melena or bright red blood per rectum. GENITOURINARY: No urinary frequency, urgency, hesitancy or dysuria. MUSCULOSKELETAL: No joint or muscle pain, no back pain, no recent trauma. DERMATOLOGIC: No rash, no itching, no lesions. ENDOCRINE: No polyuria, polydipsia, no heat or cold intolerance. No recent change in weight. HEMATOLOGICAL: No anemia or easy  bruising or bleeding. NEUROLOGIC: No headache, seizures, numbness, tingling or weakness. PSYCHIATRIC: No depression, no loss of interest in normal activity or change in sleep pattern.     Exam:   BP 106/70   Pulse 70   Ht 5' 4.5" (1.638 m)   Wt 158 lb (71.7 kg)   LMP 02/08/2022 (Exact Date) Comment: ocps  SpO2 98%   BMI 26.70 kg/m   Body mass index is 26.7 kg/m.  General appearance : Well developed well nourished female. No acute distress HEENT: Eyes: no retinal hemorrhage or exudates,  Neck supple, trachea midline, no carotid bruits, no thyroidmegaly Lungs: Clear to auscultation, no rhonchi or wheezes, or rib retractions  Heart: Regular rate and rhythm, no murmurs or gallops Breast:Examined in sitting and supine position were symmetrical in appearance, no palpable masses or tenderness,  no skin retraction, no nipple inversion, no nipple discharge, no skin discoloration, no axillary or supraclavicular lymphadenopathy Abdomen: no palpable masses or tenderness, no rebound or guarding Extremities: no edema or skin discoloration or tenderness  Pelvic: Vulva: Normal             Vagina: No gross lesions or discharge  Cervix: No gross lesions or discharge.  Pap reflex done.  Uterus  AV, normal size, shape and consistency, non-tender and mobile  Adnexa  Without masses or tenderness  Anus: Normal   Assessment/Plan:  51 y.o. female for annual exam   1. Encounter for routine gynecological examination with Papanicolaou smear of cervix PMDD symptoms improved on BCPs with LoEstrin 1/20.  Having light menses every 1-3 months.  Abnormal uterine bleeding precautions reviewed.  On Effexor 75 mg twice daily. No pelvic pain. No pain with intercourse.  Pap Neg 01/2019.  Repeat Pap today. Urine and bowel movements normal.  Breasts normal.  Screening mammo Neg 04/2022.  Body mass index 26.7.  Good fitness and nutrition.  Health labs with family physician.  Will schedule Colono this year.  Flu vaccine  done at pharmacy.   - Cytology - PAP( [place])  2. Relies on partner vasectomy for contraception  3. PMDD (premenstrual dysphoric disorder) PMDD improved on LoEstrin 1/20.  Patient turning 51 yo soon, decision to change the BCPs to a Progestin-only pill to lower the risks of DVT/PE/Stroke associated with Estradiol therapy.  Norethindrone 0.35 prescription sent to pharmacy.  Other orders - norethindrone (MICRONOR) 0.35 MG tablet; Take 1 tablet (0.35 mg total) by mouth daily.   Genia Del MD, 9:18 AM 05/07/2022

## 2022-05-09 LAB — CYTOLOGY - PAP
Comment: NEGATIVE
Diagnosis: NEGATIVE
High risk HPV: NEGATIVE

## 2023-04-10 ENCOUNTER — Other Ambulatory Visit: Payer: Self-pay | Admitting: Family Medicine

## 2023-04-10 DIAGNOSIS — Z1231 Encounter for screening mammogram for malignant neoplasm of breast: Secondary | ICD-10-CM

## 2023-05-07 ENCOUNTER — Other Ambulatory Visit: Payer: Self-pay | Admitting: Nurse Practitioner

## 2023-05-07 ENCOUNTER — Ambulatory Visit
Admission: RE | Admit: 2023-05-07 | Discharge: 2023-05-07 | Disposition: A | Discharge status: Home / Self Care | Payer: BC Managed Care – PPO | Source: Ambulatory Visit | Attending: Family Medicine | Admitting: Family Medicine

## 2023-05-07 DIAGNOSIS — Z1231 Encounter for screening mammogram for malignant neoplasm of breast: Secondary | ICD-10-CM

## 2023-05-12 ENCOUNTER — Ambulatory Visit (INDEPENDENT_AMBULATORY_CARE_PROVIDER_SITE_OTHER): Payer: BC Managed Care – PPO | Admitting: Nurse Practitioner

## 2023-05-12 ENCOUNTER — Encounter: Payer: Self-pay | Admitting: Nurse Practitioner

## 2023-05-12 VITALS — BP 112/72 | HR 60 | Ht 65.0 in | Wt 160.0 lb

## 2023-05-12 DIAGNOSIS — F3281 Premenstrual dysphoric disorder: Secondary | ICD-10-CM

## 2023-05-12 DIAGNOSIS — Z01419 Encounter for gynecological examination (general) (routine) without abnormal findings: Secondary | ICD-10-CM

## 2023-05-12 MED ORDER — NORETHINDRONE 0.35 MG PO TABS: 1.0000 | ORAL_TABLET | Freq: Every day | ORAL | 4 refills | Status: DC

## 2023-05-12 NOTE — Progress Notes (Signed)
Natalie Reeves 03/19/1971 [id number]   History:  52 y.o. G3P3003 presents for annual exam. Switched from COCs to POPs last year due to age per Dr. Seymour Bars. Taking for management of PMDD. Taking Effexor. Irregular menses, not new for her. Complains of some night sweats. Cryosurgery 30 years ago, normal paps since. Going through divorce. Ex husband has HSV and patient has questions about this. Planning to ask PCP to check when she has her annual blood work done.   Gynecologic History Patient's last menstrual period was 01/28/2023 (exact date).   Contraception/Family planning: oral progesterone-only contraceptive Sexually active: No  Health Maintenance Last Pap: 05/07/2022. Results were: Normal neg HPV, 5-year repeat Last mammogram: 05/07/2023. Results were: Normal Last colonoscopy: 2024. 5-year recall Last Dexa: Not indicated  Past medical history, past surgical history, family history and social history were all reviewed and documented in the EPIC chart. Separated. 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 Mother with history of uterine and breast cancer.   ROS:  A ROS was performed and pertinent positives and negatives are included.  Exam:  Vitals:   05/12/23 1500  BP: 112/72  Pulse: 60  SpO2: 98%  Weight: 160 lb (72.6 kg)  Height: 5\' 5"  (1.651 m)   Body mass index is 26.63 kg/m.   General appearance:  Normal Thyroid:  Symmetrical, normal in size, without palpable masses or nodularity. Respiratory  Auscultation:  Clear without wheezing or rhonchi Cardiovascular  Auscultation:  Regular rate, without rubs, murmurs or gallops  Edema/varicosities:  Not grossly evident Abdominal  Soft,nontender, without masses, guarding or rebound.  Liver/spleen:  No organomegaly noted  Hernia:  None appreciated  Skin  Inspection:  Grossly normal Breasts: Examined lying and sitting.   Right: Without masses, retractions, nipple  discharge or axillary adenopathy.   Left: Without masses, retractions, nipple discharge or axillary adenopathy. Pelvic: External genitalia:  no lesions              Urethra:  normal appearing urethra with no masses, tenderness or lesions              Bartholins and Skenes: normal                 Vagina: normal appearing vagina with normal color and discharge, no lesions              Cervix: no lesions Bimanual Exam:  Uterus:  no masses or tenderness              Adnexa: no mass, fullness, tenderness              Rectovaginal: Deferred              Anus:  normal, no lesions  Patient informed chaperone available to be present for breast and pelvic exam. Patient has requested no chaperone to be present. Patient has been advised what will be completed during breast and pelvic exam.   Assessment/Plan:  52 y.o. [id number] for annual exam.   Well female exam with routine gynecological exam - Education provided on SBEs, importance of preventative screenings, current guidelines, high calcium diet, regular exercise, and multivitamin daily. Labs with PCP.   PMDD (premenstrual dysphoric disorder) - Plan: norethindrone (MICRONOR) 0.35 MG tablet daily. Also taking Effexor. Good management.   Discussed HSV exposure, diagnosis and treatment. She is considering getting HSV antibody testing with PCP next month when she does her annual visit. Aware  no treatment will be done unless outbreaks occur. Verbalized understanding.   Screening for cervical cancer - Normal Pap history.  Will repeat at 5-year interval per guidelines.  Screening for breast cancer - Normal mammogram history.  Continue annual screenings.  Normal breast exam today.  Screening for colon cancer - 2024 colonoscopy. Will repeat at 5-year interval per GI's recommendation.   Return in about 1 year (around 05/11/2024) for Annual.   Olivia Mackie DNP, 3:34 PM 05/12/2023

## 2023-06-09 ENCOUNTER — Other Ambulatory Visit (HOSPITAL_BASED_OUTPATIENT_CLINIC_OR_DEPARTMENT_OTHER): Payer: Self-pay | Admitting: Family Medicine

## 2023-06-09 DIAGNOSIS — E785 Hyperlipidemia, unspecified: Secondary | ICD-10-CM

## 2023-07-03 ENCOUNTER — Encounter: Payer: Self-pay | Admitting: Nurse Practitioner

## 2023-07-03 ENCOUNTER — Ambulatory Visit (HOSPITAL_BASED_OUTPATIENT_CLINIC_OR_DEPARTMENT_OTHER)
Admission: RE | Admit: 2023-07-03 | Discharge: 2023-07-03 | Disposition: A | Discharge status: Home / Self Care | Payer: Self-pay | Source: Ambulatory Visit | Attending: Family Medicine | Admitting: Family Medicine

## 2023-07-03 DIAGNOSIS — N926 Irregular menstruation, unspecified: Secondary | ICD-10-CM

## 2023-07-03 DIAGNOSIS — N921 Excessive and frequent menstruation with irregular cycle: Secondary | ICD-10-CM

## 2023-07-03 DIAGNOSIS — E785 Hyperlipidemia, unspecified: Secondary | ICD-10-CM | POA: Insufficient documentation

## 2023-07-08 NOTE — Telephone Encounter (Signed)
Per CS: "Left message for patient to call and schedule appointment." 

## 2023-07-11 NOTE — Telephone Encounter (Signed)
U/s appt & consult with tiffany is scheduled for 07-17-23

## 2023-07-17 ENCOUNTER — Encounter: Payer: Self-pay | Admitting: Nurse Practitioner

## 2023-07-17 ENCOUNTER — Ambulatory Visit: Payer: 59 | Admitting: Nurse Practitioner

## 2023-07-17 ENCOUNTER — Ambulatory Visit (INDEPENDENT_AMBULATORY_CARE_PROVIDER_SITE_OTHER): Payer: 59

## 2023-07-17 VITALS — BP 118/64 | HR 80 | Ht 65.0 in | Wt 159.0 lb

## 2023-07-17 DIAGNOSIS — N926 Irregular menstruation, unspecified: Secondary | ICD-10-CM | POA: Diagnosis not present

## 2023-07-17 DIAGNOSIS — N951 Menopausal and female climacteric states: Secondary | ICD-10-CM

## 2023-07-17 DIAGNOSIS — F3281 Premenstrual dysphoric disorder: Secondary | ICD-10-CM

## 2023-07-17 NOTE — Progress Notes (Signed)
   Acute Office Visit  Subjective:    Patient ID: Natalie Reeves, female    DOB: 1970/08/12, 53 y.o.   MRN: [id number]   HPI 53 y.o. presents today for ultrasound for menstrual changes. Went a few months without a cycle in the fall, then had a few cycles that were more frequent (20-23 days). Last month she had 20 days of light consecutive bleeding, stopped for 2 days then had normal menses 07/05/23.  Going through stressful divorce. Very mild vasomotor symptoms, mostly occasional night sweats. Taking POPs. H/O PMDD, good management with Norethindrone and Effexor. Prefers not to change anything since doing so well emotionally.   Patient's last menstrual period was 07/05/2023 (exact date).    Review of Systems  Constitutional: Negative.   Genitourinary:  Positive for menstrual problem.       Objective:    Physical Exam Constitutional:      Appearance: Normal appearance.   GU: Not indicated  BP 118/64 (BP Location: Right Arm, Patient Position: Sitting, Cuff Size: Normal)   Pulse 80   Ht 5\' 5"  (1.651 m)   Wt 159 lb (72.1 kg)   LMP 07/05/2023 (Exact Date)   SpO2 99%   BMI 26.46 kg/m  Wt Readings from Last 3 Encounters:  07/17/23 159 lb (72.1 kg)  05/12/23 160 lb (72.6 kg)  05/07/22 158 lb (71.7 kg)        Assessment & Plan:   Problem List Items Addressed This Visit   None Visit Diagnoses       Perimenopausal    -  Primary     Menstrual changes         PMDD (premenstrual dysphoric disorder)          Vaginal ultrasound: Anteverted uterus, normal size and shape, no myometrial masses.  Thin, symmetrical endometrium - 6.95 mm.  No masses or thickening seen, avascular.  Both ovaries normal.  No adnexal masses, no free fluid.  Dilated vessels bilateral adnexa left greater than right.  Pelvic congestion syndrome?  Plan: Normal ultrasound reviewed. Changes likely s/t perimenopause. Option to switch to different OCP. Declines because PMDD is well managed at this time and  bleeding is not very bothersome. Will monitor.   Return if symptoms worsen or fail to improve.    Olivia Mackie DNP, 9:43 AM 07/17/2023

## 2024-03-23 ENCOUNTER — Other Ambulatory Visit: Payer: Self-pay | Admitting: Nurse Practitioner

## 2024-03-23 DIAGNOSIS — Z1231 Encounter for screening mammogram for malignant neoplasm of breast: Secondary | ICD-10-CM

## 2024-05-07 ENCOUNTER — Ambulatory Visit: Payer: Self-pay

## 2024-05-07 ENCOUNTER — Ambulatory Visit
Admission: RE | Admit: 2024-05-07 | Discharge: 2024-05-07 | Disposition: A | Discharge status: Home / Self Care | Source: Ambulatory Visit | Attending: Nurse Practitioner | Admitting: Nurse Practitioner

## 2024-05-07 DIAGNOSIS — Z1231 Encounter for screening mammogram for malignant neoplasm of breast: Secondary | ICD-10-CM

## 2024-05-17 ENCOUNTER — Ambulatory Visit: Payer: BC Managed Care – PPO | Admitting: Nurse Practitioner

## 2024-05-18 ENCOUNTER — Encounter: Payer: Self-pay | Admitting: Nurse Practitioner

## 2024-05-18 ENCOUNTER — Ambulatory Visit: Admitting: Nurse Practitioner

## 2024-05-18 VITALS — BP 112/70 | HR 71 | Ht 64.75 in | Wt 159.0 lb

## 2024-05-18 DIAGNOSIS — F3281 Premenstrual dysphoric disorder: Secondary | ICD-10-CM | POA: Diagnosis not present

## 2024-05-18 DIAGNOSIS — R109 Unspecified abdominal pain: Secondary | ICD-10-CM | POA: Diagnosis not present

## 2024-05-18 DIAGNOSIS — Z1331 Encounter for screening for depression: Secondary | ICD-10-CM | POA: Diagnosis not present

## 2024-05-18 DIAGNOSIS — Z01419 Encounter for gynecological examination (general) (routine) without abnormal findings: Secondary | ICD-10-CM | POA: Diagnosis not present

## 2024-05-18 MED ORDER — NORETHINDRONE 0.35 MG PO TABS: 1.0000 | ORAL_TABLET | Freq: Every day | ORAL | 4 refills | Status: AC

## 2024-05-18 NOTE — Progress Notes (Signed)
 Natalie Reeves [birth year] [id number]   History:  53 y.o. G3P3003 presents for annual exam. Complains of left-sided pain that started a couple of weeks ago. Worse after sitting or lying for long periods, better with walking. Initially started in lower back but now has radiated to side and left lower abdomen. No known injury. Saw chiropractor once. No changes in bowels, no urinary or vaginal symptoms. POPs and Effexor for management of PMDD. LMP in July. Cryosurgery 30 years ago, normal paps since.   Gynecologic History Patient's last menstrual period was 01/08/2024 (exact date).   Contraception/Family planning: oral progesterone-only contraceptive Sexually active: No  Health Maintenance Last Pap: 05/07/2022. Results were: Normal neg HPV Last mammogram: 05/07/2024. Results were: Normal Last colonoscopy: 2024. 5-year recall Last Dexa: Not indicated     05/18/2024    4:06 PM  Depression screen PHQ 2/9  Decreased Interest 0  Down, Depressed, Hopeless 0  PHQ - 2 Score 0     Past medical history, past surgical history, family history and social history were all reviewed and documented in the EPIC chart. Separated. 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 Mother with history of uterine and breast cancer.   ROS:  A ROS was performed and pertinent positives and negatives are included.  Exam:  Vitals:   05/18/24 1602  BP: 112/70  Pulse: 71  SpO2: 98%  Weight: 159 lb (72.1 kg)  Height: 5' 4.75 (1.645 m)    Body mass index is 26.66 kg/m.   General appearance:  Normal Thyroid:  Symmetrical, normal in size, without palpable masses or nodularity. Respiratory  Auscultation:  Clear without wheezing or rhonchi Cardiovascular  Auscultation:  Regular rate, without rubs, murmurs or gallops  Edema/varicosities:  Not grossly evident Abdominal  Soft, mild tenderness with deep palpation in LLQ, without masses, guarding or  rebound.  Liver/spleen:  No organomegaly noted  Hernia:  None appreciated  Skin  Inspection:  Grossly normal Breasts: Examined lying and sitting.   Right: Without masses, retractions, nipple discharge or axillary adenopathy.   Left: Without masses, retractions, nipple discharge or axillary adenopathy. Pelvic: External genitalia:  no lesions              Urethra:  normal appearing urethra with no masses, tenderness or lesions              Bartholins and Skenes: normal                 Vagina: normal appearing vagina with normal color and discharge, no lesions              Cervix: no lesions Bimanual Exam:  Uterus:  no masses or tenderness              Adnexa: no mass, fullness, tenderness              Rectovaginal: Deferred              Anus:  normal, no lesions  Zada Louder, CMA present as chaperone.   Assessment/Plan:  53 y.o. [id number] for annual exam.   Well female exam with routine gynecological exam - Education provided on SBEs, importance of preventative screenings, current guidelines, high calcium diet, regular exercise, and multivitamin daily. Labs with PCP.   PMDD (premenstrual dysphoric disorder) - Plan: norethindrone  (MICRONOR ) 0.35 MG tablet daily. Also taking Effexor. Good management.   Depression screening - PHQ - 0  Left  sided abdominal pain - likely musculoskeletal in nature. Recommend rest, heat, NSAIDS/Tylenol. Plans to see chiropractor a couple of more times. Declines muscle relaxer. Will reach out if pain continues.   Screening for cervical cancer - Normal Pap history.  Will repeat at 5-year interval per guidelines.  Screening for breast cancer - Normal mammogram history.  Continue annual screenings.  Normal breast exam today.  Screening for colon cancer - 2024 colonoscopy. Will repeat at 5-year interval per GI's recommendation.   Return in about 1 year (around 05/18/2025) for Annual.     Natalie DELENA Shutter DNP, 4:27 PM 05/18/2024

## 2025-05-19 ENCOUNTER — Ambulatory Visit: Admitting: Nurse Practitioner
